# Patient Record
Sex: Female | Born: 1945 | Race: White | Hispanic: No | Marital: Married
Health system: Southern US, Community
[De-identification: ages and names within clinical notes are randomized; demographics above are authoritative.]

---

## 2000-01-24 ENCOUNTER — Encounter: Payer: Self-pay | Admitting: Obstetrics and Gynecology

## 2000-01-24 ENCOUNTER — Encounter: Admission: RE | Admit: 2000-01-24 | Discharge: 2000-01-24 | Payer: Self-pay | Admitting: Obstetrics and Gynecology

## 2000-02-28 ENCOUNTER — Other Ambulatory Visit: Admission: RE | Admit: 2000-02-28 | Discharge: 2000-02-28 | Payer: Self-pay | Admitting: Obstetrics and Gynecology

## 2001-01-29 ENCOUNTER — Encounter: Admission: RE | Admit: 2001-01-29 | Discharge: 2001-01-29 | Payer: Self-pay | Admitting: Obstetrics and Gynecology

## 2001-01-29 ENCOUNTER — Encounter: Payer: Self-pay | Admitting: Obstetrics and Gynecology

## 2001-01-29 ENCOUNTER — Other Ambulatory Visit: Admission: RE | Admit: 2001-01-29 | Discharge: 2001-01-29 | Payer: Self-pay | Admitting: Obstetrics and Gynecology

## 2001-02-12 ENCOUNTER — Encounter: Payer: Self-pay | Admitting: Obstetrics and Gynecology

## 2001-02-12 ENCOUNTER — Encounter: Admission: RE | Admit: 2001-02-12 | Discharge: 2001-02-12 | Payer: Self-pay | Admitting: Obstetrics and Gynecology

## 2002-03-25 ENCOUNTER — Other Ambulatory Visit: Admission: RE | Admit: 2002-03-25 | Discharge: 2002-03-25 | Payer: Self-pay | Admitting: Gynecology

## 2002-03-25 ENCOUNTER — Encounter: Payer: Self-pay | Admitting: Gynecology

## 2002-03-25 ENCOUNTER — Encounter: Admission: RE | Admit: 2002-03-25 | Discharge: 2002-03-25 | Payer: Self-pay | Admitting: Gynecology

## 2002-07-29 ENCOUNTER — Ambulatory Visit (HOSPITAL_COMMUNITY): Admission: RE | Admit: 2002-07-29 | Discharge: 2002-07-29 | Payer: Self-pay | Admitting: Gastroenterology

## 2002-07-29 ENCOUNTER — Encounter (INDEPENDENT_AMBULATORY_CARE_PROVIDER_SITE_OTHER): Payer: Self-pay | Admitting: *Deleted

## 2003-03-31 ENCOUNTER — Encounter: Admission: RE | Admit: 2003-03-31 | Discharge: 2003-03-31 | Payer: Self-pay | Admitting: Emergency Medicine

## 2003-03-31 ENCOUNTER — Encounter: Payer: Self-pay | Admitting: Emergency Medicine

## 2003-03-31 ENCOUNTER — Other Ambulatory Visit: Admission: RE | Admit: 2003-03-31 | Discharge: 2003-03-31 | Payer: Self-pay | Admitting: Gynecology

## 2003-12-10 ENCOUNTER — Emergency Department (HOSPITAL_COMMUNITY): Admission: EM | Admit: 2003-12-10 | Discharge: 2003-12-10 | Payer: Self-pay | Admitting: Emergency Medicine

## 2004-04-12 ENCOUNTER — Encounter: Admission: RE | Admit: 2004-04-12 | Discharge: 2004-04-12 | Payer: Self-pay | Admitting: Gynecology

## 2004-04-19 ENCOUNTER — Other Ambulatory Visit: Admission: RE | Admit: 2004-04-19 | Discharge: 2004-04-19 | Payer: Self-pay | Admitting: Gynecology

## 2005-05-23 ENCOUNTER — Other Ambulatory Visit: Admission: RE | Admit: 2005-05-23 | Discharge: 2005-05-23 | Payer: Self-pay | Admitting: Gynecology

## 2005-05-31 ENCOUNTER — Encounter: Admission: RE | Admit: 2005-05-31 | Discharge: 2005-05-31 | Payer: Self-pay | Admitting: Gynecology

## 2006-07-11 ENCOUNTER — Encounter: Admission: RE | Admit: 2006-07-11 | Discharge: 2006-07-11 | Payer: Self-pay | Admitting: Gynecology

## 2006-07-25 ENCOUNTER — Other Ambulatory Visit: Admission: RE | Admit: 2006-07-25 | Discharge: 2006-07-25 | Payer: Self-pay | Admitting: Gynecology

## 2007-07-17 ENCOUNTER — Encounter: Admission: RE | Admit: 2007-07-17 | Discharge: 2007-07-17 | Payer: Self-pay | Admitting: Gynecology

## 2007-08-14 ENCOUNTER — Other Ambulatory Visit: Admission: RE | Admit: 2007-08-14 | Discharge: 2007-08-14 | Payer: Self-pay | Admitting: Gynecology

## 2008-02-23 ENCOUNTER — Ambulatory Visit (HOSPITAL_COMMUNITY): Admission: RE | Admit: 2008-02-23 | Discharge: 2008-02-23 | Payer: Self-pay | Admitting: Ophthalmology

## 2008-07-29 ENCOUNTER — Encounter: Admission: RE | Admit: 2008-07-29 | Discharge: 2008-07-29 | Payer: Self-pay | Admitting: Gynecology

## 2009-08-23 ENCOUNTER — Encounter: Admission: RE | Admit: 2009-08-23 | Discharge: 2009-08-23 | Payer: Self-pay | Admitting: Gynecology

## 2010-09-28 ENCOUNTER — Encounter: Admission: RE | Admit: 2010-09-28 | Discharge: 2010-09-28 | Payer: Self-pay | Admitting: Gynecology

## 2011-05-15 NOTE — Op Note (Signed)
[name], Jordan [name]              ACCOUNT NO.:  [id number]   MEDICAL RECORD NO.:  [id number]          PATIENT TYPE:  AMB   LOCATION:  SDS                          FACILITY:  [facility]   PHYSICIAN:  Jillyn Hidden A. Rankin, M.D.   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   PROCEDURE:  Posterior vitrectomy with membrane peel - internal limiting  membrane peel - 25 gauge OD.   SURGEON:  Alford Highland. Rankin, M.D.   ANESTHESIA:  Local retrobulbar monitored anesthesia control.   INDICATIONS FOR PROCEDURE:  The patient is a 65 year old woman who has  profound vision loss on the basis of macular topographic distortion in  this right eye with significant vision loss.  The patient understands  this is an attempt to peel off the topographic distortion so as to allow  the underlying retina to improve its anatomic natural state as well as  hopefully improve visual acuity.  She understands the risks of  anesthesia including the rare occurrence of death, but also to the eye  including but not limited to hemorrhage, infection, scarring, need for  further surgery, no change in vision, loss of vision, and progression of  disease despite intervention.  After appropriate signed consent was  obtained, the patient was taken to the operating room.   DESCRIPTION OF PROCEDURE:  In the operating room, appropriate monitoring  was followed by mild sedation.  5 mL of 2% Xylocaine was injected  retrobulbar with an additional 5 mL laterally in fashion modified Darel Hong.  The right periocular region was sterilely prepped and draped in  the usual  ophthalmic fashion.  The lid speculum applied.  25 gauge  trocar placed in the inferotemporal quadrant.  Superior trocars applied.  A core vitrectomy was then begun.  25 gauge vitrectomy.  Vitreous skirt  trimmed 360 degrees.  Severe topographic distortion was identified.  There appeared to be a combination of epiretinal membrane and internal  limiting membrane present, but the internal limiting membrane was  grasped and this was removed as a continuous sheet overlying the macula  and fovea region and typical whitening of the surface of the retina was  noted.   At this time, peripheral retina was inspected and found to be free of  retinal holes or tears.  No complications occurred.  At this time the  superior trocars were removed.  The infusion was removed.  Subconjunctival Decadron applied.  Sterile patch and Fox shield applied.  The patient tolerated the procedure without complications.      Alford Highland Rankin, M.D.  Electronically Signed     GAR/MEDQ  D:  02/23/2008  T:  02/24/2008  Job:  [id number]   cc:   Richarda Overlie, M.D.

## 2011-05-18 NOTE — Op Note (Signed)
   NAMEELISAVET, BUEHRER                        ACCOUNT NO.:  [id number]   MEDICAL RECORD NO.:  [id number]                   PATIENT TYPE:  AMB   LOCATION:  ENDO                                 FACILITY:  [facility]   PHYSICIAN:  Vida Rigger, M.D.                    DATE OF BIRTH:  03-14-1946   DATE OF PROCEDURE:  07/29/2002  DATE OF DISCHARGE:  07/29/2002                                 OPERATIVE REPORT   PROCEDURE:  Colonoscopy.   SURGEON:  Petra Kuba, M.D.   INDICATIONS FOR PROCEDURE:  Screening.  Consent was signed after risks,  benefits, methods, and options were thoroughly discussed in the office.   MEDICINES:  Demerol 70 and Versed 10.   DESCRIPTION OF PROCEDURE:  Rectal inspection was pertinent for external  hemorrhoids.  Digital exam was negative.  The pediatric video adjustable  colonoscope was inserted and easily advanced around the colon to the cecum.  This did require rolling her on her back and some abdominal pressure.   Upon insertion, left-sided diverticula were seen, but no other  abnormalities.  The cecum was identified by the appendiceal orifice and the  ileocecal valve.  The scope was then slowly withdrawn.  The prep was  adequate.  There was some liquid stool that required washing and suctioning.  The cecum, ascending, and transverse were normal.  The scope was withdrawn  around the left side of the colon.  There was mild to moderate left-sided  diverticula.  In the mid to distal sigmoid, a small polyp was seen, snared,  and electrocautery applied, and the polyp was removed, suctioned through the  scope, and collected in the trap.   The scope was withdrawn back to the rectum and retroflexed and pertinent for  some small internal hemorrhoids.  The scope was straightened and readvanced  a short ways up to the left side of the colon.  Air was suctioned and the  scope removed.   The patient tolerated the procedure well.  There was no obvious or immediate  complication.   ENDOSCOPIC ASSESSMENT:  1. Internal and external hemorrhoids.  2. Left-sided diverticula.  3. Small sigmoid polyp, status post snare.  4. Otherwise, within normal limits to the cecum.    PLAN:  Await pathology to determine future colonic screening, yearly rectals  and guaiacs per Dr. Doristine Counter and continue workup with a one-time EGD.  Please  see that dictation for further workup and plans.                                               Vida Rigger, M.D.    MM/MEDQ  D:  07/29/2002  T:  08/03/2002  Job:  [id number]   cc:   Delorse Lek, M.D.

## 2011-05-18 NOTE — Op Note (Signed)
   Jordan [name], [name]                        ACCOUNT NO.:  [id number]   MEDICAL RECORD NO.:  [id number]                   PATIENT TYPE:  AMB   LOCATION:  ENDO                                 FACILITY:  [facility]   PHYSICIAN:  Petra Kuba, M.D.                 DATE OF BIRTH:  [birth year]   DATE OF PROCEDURE:  07/29/2002  DATE OF DISCHARGE:                                 OPERATIVE REPORT   PROCEDURE:  EGD with biopsy.   INDICATIONS FOR PROCEDURE:  Longstanding upper tract symptoms, want to rule  out Barrett's or other abnormalities.  Consent was signed after risks,  benefits, methods. and options were thoroughly discussed in the office and  before any pre medications were given.   MEDICATIONS FOR PROCEDURE:  Demerol 30 mg, Versed 3 mg.   DESCRIPTION OF PROCEDURE:  The videoendoscope was inserted by direct vision.  The esophagus was normal.  In the distal esophagus, was a small to medium  size hiatal hernia.  The scope was passed into the stomach and advanced  through a normal antrum, normal pylorus, into a normal duodenal bulb and  advanced into a normal descending duodenum.  The scope was withdrawn back to  the bulb and a good look there ruled out ulcers in that location.  The scope  was withdrawn back to the stomach and retroflexed.  High in the cardia the  hiatal hernia was confirmed.  The angularis was normal.  Along the lesser  and greater curve, multiple tiny and small gastric polyps were seen.   The scope was straightened and straight visualization of the stomach  confirmed the multiple polyps and also confirmed some mild gastritis.  No  significant polyp or worrisome abnormality was seen.  Multiple biopsies were  obtained but it was impossible to biopsy and sample all of the polyps.  Air  was suctioned, the scope slowly withdrawn.  Again, a good look at the  esophagus was normal.  The scope was removed.   The patient tolerated the procedure well.  There was no obvious  immediate  complication.   ENDOSCOPIC DIAGNOSES:  1. Small to medium sized hiatal hernia.  2. Multiple tiny and small gastric polyps without worrisome stigmata status     post biopsy of some.  3. Mild gastritis.  4. Otherwise, normal esophagogastroduodenoscopy.   PLAN:  1. Await pathology.  2. Continue pump inhibitors.  3. Follow up p.r.n. or in six months.                                              Petra Kuba, M.D.   MEM/MEDQ  D:  07/29/2002  T:  08/03/2002  Job:  [id number]   cc:   Jordan [name], M.D.

## 2011-08-27 ENCOUNTER — Other Ambulatory Visit: Payer: Self-pay | Admitting: Gynecology

## 2011-08-27 DIAGNOSIS — Z1231 Encounter for screening mammogram for malignant neoplasm of breast: Secondary | ICD-10-CM

## 2011-09-13 ENCOUNTER — Other Ambulatory Visit: Payer: Self-pay | Admitting: Gastroenterology

## 2011-09-18 ENCOUNTER — Ambulatory Visit
Admission: RE | Admit: 2011-09-18 | Discharge: 2011-09-18 | Disposition: A | Discharge status: Home / Self Care | Payer: BC Managed Care – PPO | Source: Ambulatory Visit | Attending: Gastroenterology | Admitting: Gastroenterology

## 2011-09-21 LAB — PROTIME-INR
INR: 0.9
Prothrombin Time: 12.2

## 2011-09-21 LAB — BASIC METABOLIC PANEL
BUN: 10
CO2: 27
Calcium: 9.7
Chloride: 105
Creatinine, Ser: 0.73
GFR calc Af Amer: >60
GFR calc non Af Amer: >60
Glucose, Bld: 91
Potassium: 4
Sodium: 139

## 2011-09-21 LAB — CBC
HCT: 38.8
Hemoglobin: 13
MCHC: 33.4
MCV: 90.1
Platelets: 289
RBC: 4.31
RDW: 14
WBC: 6.9

## 2011-09-21 LAB — APTT: aPTT: 29

## 2011-10-02 ENCOUNTER — Ambulatory Visit
Admission: RE | Admit: 2011-10-02 | Discharge: 2011-10-02 | Disposition: A | Discharge status: Home / Self Care | Payer: Medicare Other | Source: Ambulatory Visit | Attending: Gynecology | Admitting: Gynecology

## 2011-10-02 DIAGNOSIS — Z1231 Encounter for screening mammogram for malignant neoplasm of breast: Secondary | ICD-10-CM

## 2011-10-12 ENCOUNTER — Emergency Department (HOSPITAL_COMMUNITY): Payer: Medicare Other

## 2011-10-12 ENCOUNTER — Emergency Department (HOSPITAL_COMMUNITY)
Admission: EM | Admit: 2011-10-12 | Discharge: 2011-10-12 | Disposition: A | Discharge status: Home / Self Care | Payer: Medicare Other | Attending: Emergency Medicine | Admitting: Emergency Medicine

## 2011-10-12 DIAGNOSIS — R0602 Shortness of breath: Secondary | ICD-10-CM | POA: Insufficient documentation

## 2011-10-12 DIAGNOSIS — R110 Nausea: Secondary | ICD-10-CM | POA: Insufficient documentation

## 2011-10-12 DIAGNOSIS — E785 Hyperlipidemia, unspecified: Secondary | ICD-10-CM | POA: Insufficient documentation

## 2011-10-12 DIAGNOSIS — R0989 Other specified symptoms and signs involving the circulatory and respiratory systems: Secondary | ICD-10-CM | POA: Insufficient documentation

## 2011-10-12 DIAGNOSIS — R0609 Other forms of dyspnea: Secondary | ICD-10-CM | POA: Insufficient documentation

## 2011-10-12 LAB — COMPREHENSIVE METABOLIC PANEL
ALT: 31 U/L (ref 0–35)
AST: 23 U/L (ref 0–37)
Albumin: 3.4 g/dL — ABNORMAL LOW (ref 3.5–5.2)
Alkaline Phosphatase: 78 U/L (ref 39–117)
BUN: 12 mg/dL (ref 6–23)
CO2: 23 mEq/L (ref 19–32)
Calcium: 8.7 mg/dL (ref 8.4–10.5)
Chloride: 103 mEq/L (ref 96–112)
Creatinine, Ser: 0.6 mg/dL (ref 0.50–1.10)
GFR calc Af Amer: >90 mL/min (ref >90)
GFR calc non Af Amer: >90 mL/min (ref >90)
Glucose, Bld: 126 mg/dL — ABNORMAL HIGH (ref 70–99)
Potassium: 3.4 mEq/L — ABNORMAL LOW (ref 3.5–5.1)
Sodium: 137 mEq/L (ref 135–145)
Total Bilirubin: 0.4 mg/dL (ref 0.3–1.2)
Total Protein: 6.8 g/dL (ref 6.0–8.3)

## 2011-10-12 LAB — CBC
HCT: 32.5 % — ABNORMAL LOW (ref 36.0–46.0)
Hemoglobin: 10.5 g/dL — ABNORMAL LOW (ref 12.0–15.0)
MCH: 27.6 pg (ref 26.0–34.0)
MCHC: 32.3 g/dL (ref 30.0–36.0)
MCV: 85.3 fL (ref 78.0–100.0)
Platelets: 262 10*3/uL (ref 150–400)
RBC: 3.81 MIL/uL — ABNORMAL LOW (ref 3.87–5.11)
RDW: 14.2 % (ref 11.5–15.5)
WBC: 11.1 10*3/uL — ABNORMAL HIGH (ref 4.0–10.5)

## 2011-10-12 LAB — URINALYSIS, ROUTINE W REFLEX MICROSCOPIC
Bilirubin Urine: NEGATIVE
Glucose, UA: NEGATIVE mg/dL
Hgb urine dipstick: NEGATIVE
Ketones, ur: 15 mg/dL — AB
Leukocytes, UA: NEGATIVE
Nitrite: NEGATIVE
Protein, ur: NEGATIVE mg/dL
Specific Gravity, Urine: 1.024 (ref 1.005–1.030)
Urobilinogen, UA: 0.2 mg/dL (ref 0.0–1.0)
pH: 7 (ref 5.0–8.0)

## 2011-10-12 LAB — DIFFERENTIAL
Basophils Absolute: 0 10*3/uL (ref 0.0–0.1)
Basophils Relative: 0 % (ref 0–1)
Eosinophils Absolute: 0 10*3/uL (ref 0.0–0.7)
Eosinophils Relative: 0 % (ref 0–5)
Lymphocytes Relative: 9 % — ABNORMAL LOW (ref 12–46)
Lymphs Abs: 1 10*3/uL (ref 0.7–4.0)
Monocytes Absolute: 0.6 10*3/uL (ref 0.1–1.0)
Monocytes Relative: 6 % (ref 3–12)
Neutro Abs: 9.4 10*3/uL — ABNORMAL HIGH (ref 1.7–7.7)
Neutrophils Relative %: 85 % — ABNORMAL HIGH (ref 43–77)

## 2011-10-12 LAB — CK TOTAL AND CKMB (NOT AT ARMC)
CK, MB: 2.9 ng/mL (ref 0.3–4.0)
Relative Index: 1.8 (ref 0.0–2.5)
Total CK: 165 U/L (ref 7–177)

## 2011-10-12 LAB — POCT I-STAT TROPONIN I: Troponin i, poc: 0 ng/mL (ref 0.00–0.08)

## 2011-10-12 LAB — TROPONIN I: Troponin I: <0.3 ng/mL (ref <0.30)

## 2011-10-18 ENCOUNTER — Other Ambulatory Visit: Payer: Self-pay | Admitting: Gynecology

## 2012-09-19 ENCOUNTER — Other Ambulatory Visit: Payer: Self-pay | Admitting: Gynecology

## 2012-09-19 DIAGNOSIS — Z1231 Encounter for screening mammogram for malignant neoplasm of breast: Secondary | ICD-10-CM

## 2012-09-26 IMAGING — CR DG CHEST 2V
2 series · 2 of 2 positions shown · non-contrast
Comparison: 02/20/2008

CLINICAL DATA: Chest pain and weakness

CHEST - 2 VIEW

[w chest pa]
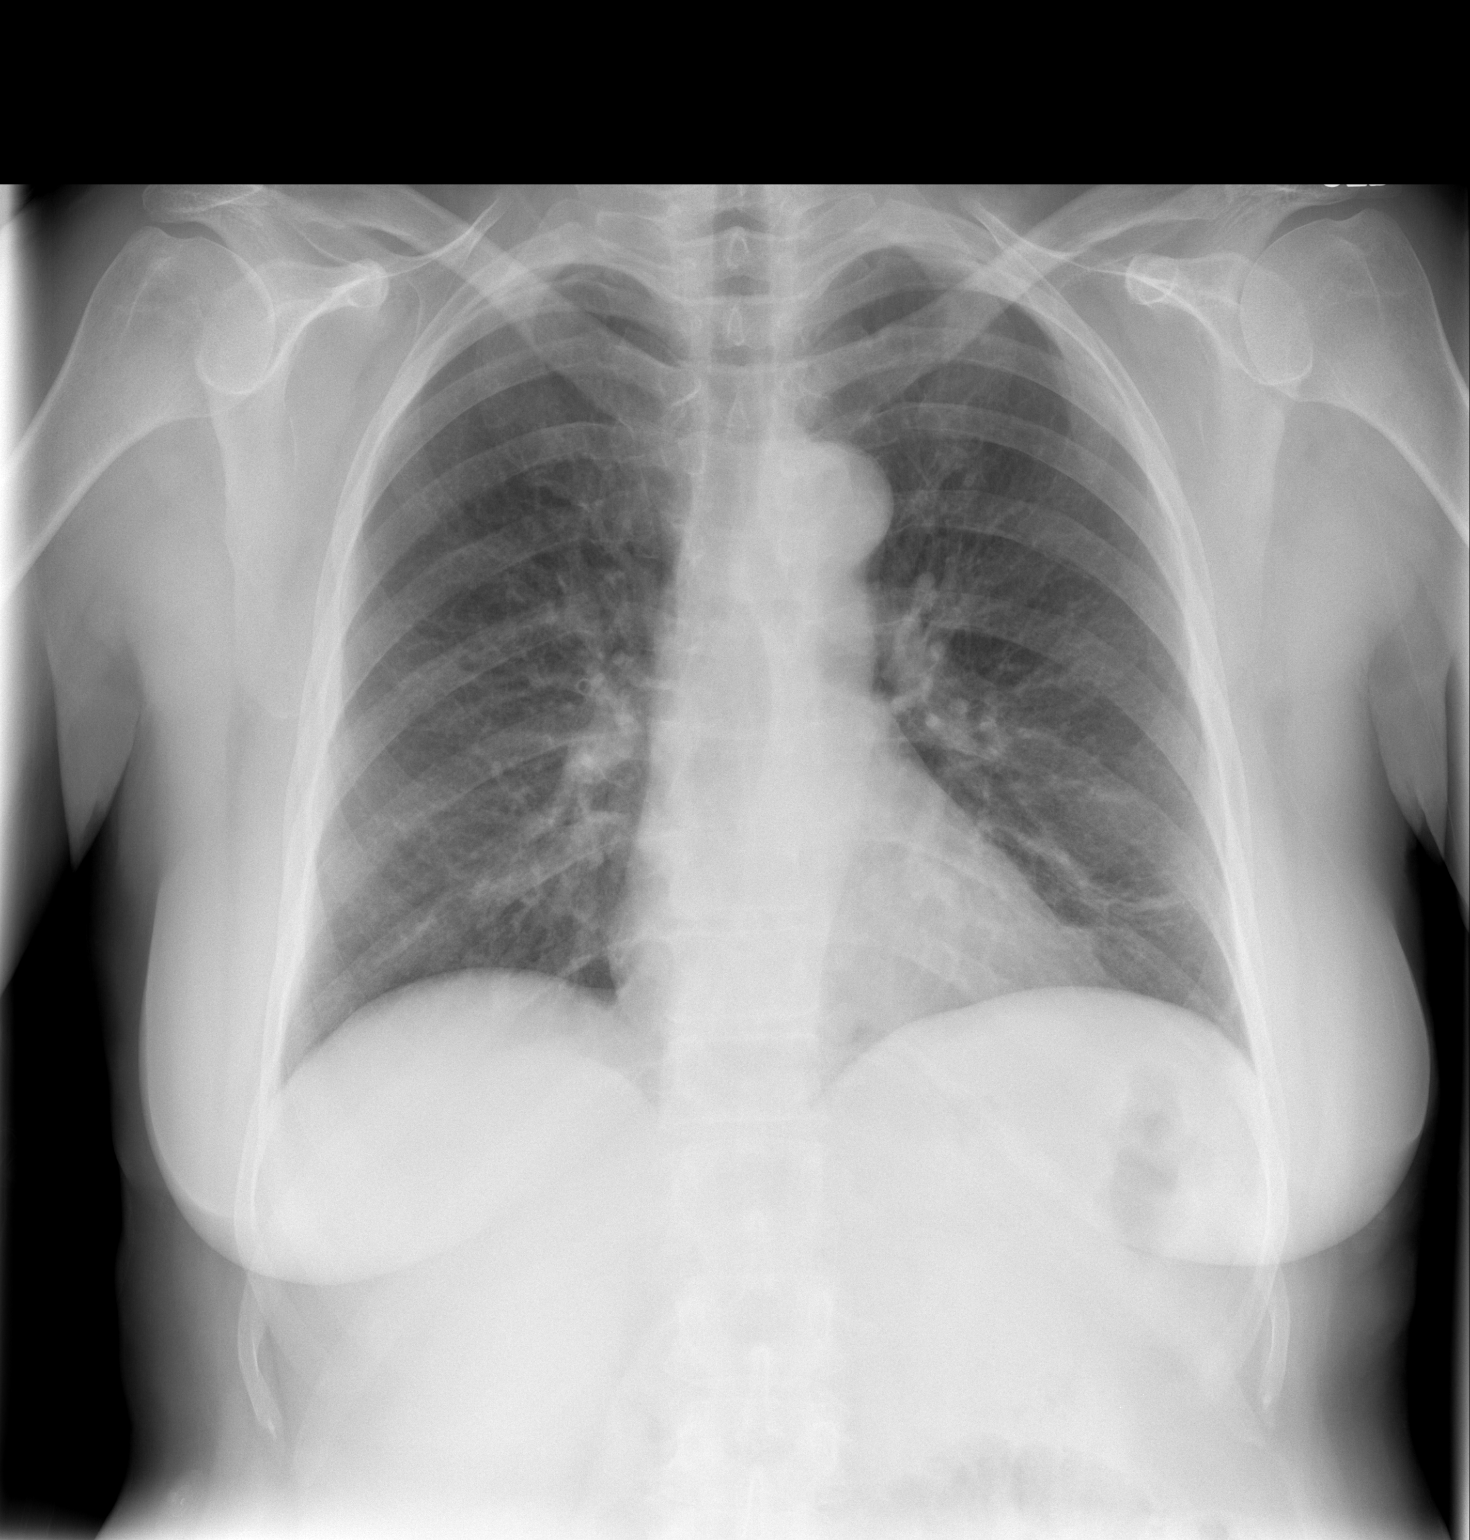

[w chest lat]
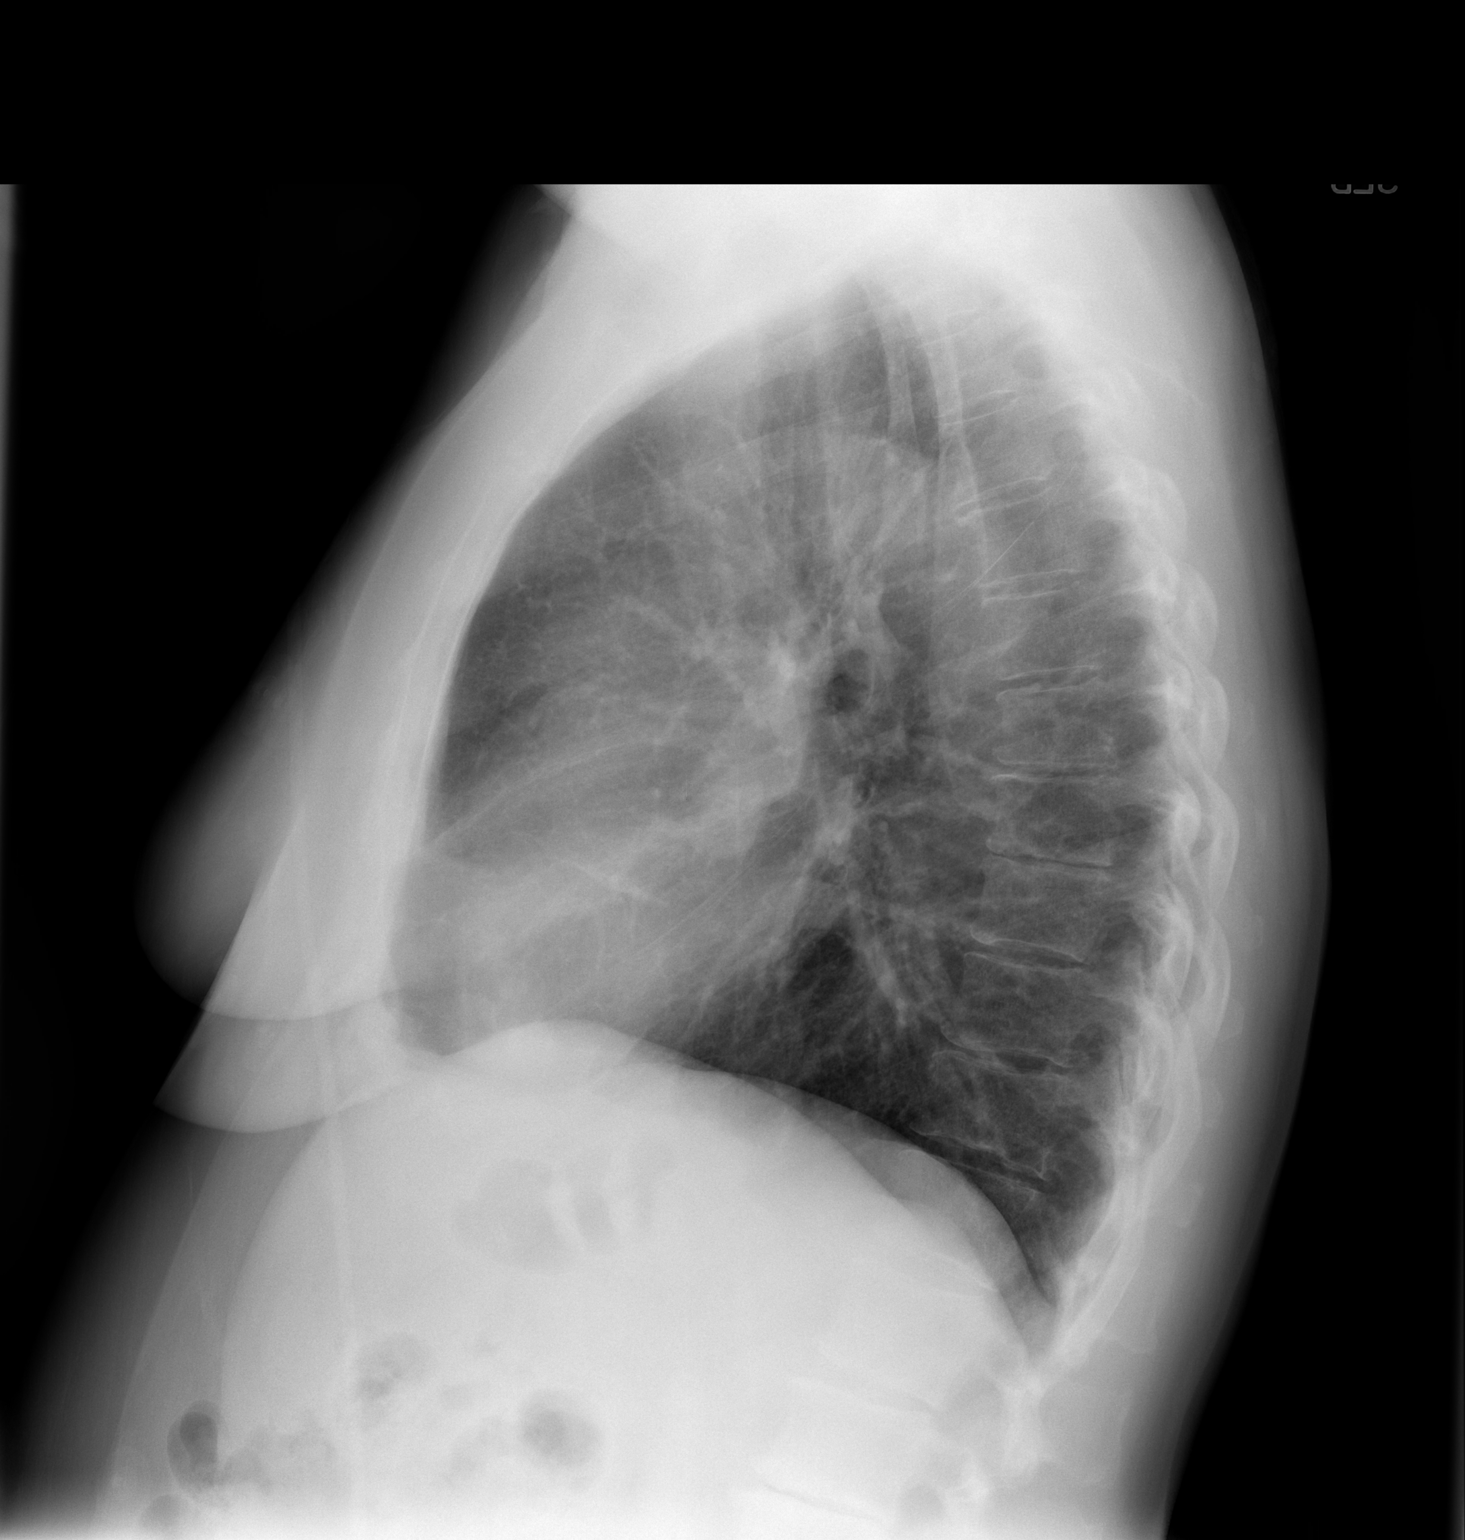

[2 of 2 positions shown; findings below may reference images not displayed]

FINDINGS: The heart and pulmonary vascularity are within normal
limits.  The lungs are well-aerated bilaterally.  Minimal scarring
remains in the left lung base stable from prior exam.  No other
focal abnormality is seen.
IMPRESSION: Stable scarring in the left lung base.  No acute abnormality is
noted.

## 2012-10-14 ENCOUNTER — Ambulatory Visit: Payer: Medicare Other

## 2012-10-16 ENCOUNTER — Ambulatory Visit
Admission: RE | Admit: 2012-10-16 | Discharge: 2012-10-16 | Disposition: A | Discharge status: Home / Self Care | Payer: Medicare Other | Source: Ambulatory Visit | Attending: Gynecology | Admitting: Gynecology

## 2012-10-16 DIAGNOSIS — Z1231 Encounter for screening mammogram for malignant neoplasm of breast: Secondary | ICD-10-CM

## 2013-09-17 ENCOUNTER — Other Ambulatory Visit: Payer: Self-pay

## 2013-09-17 DIAGNOSIS — Z1231 Encounter for screening mammogram for malignant neoplasm of breast: Secondary | ICD-10-CM

## 2013-10-20 ENCOUNTER — Ambulatory Visit
Admission: RE | Admit: 2013-10-20 | Discharge: 2013-10-20 | Disposition: A | Discharge status: Home / Self Care | Payer: Medicare Other | Source: Ambulatory Visit

## 2013-10-20 DIAGNOSIS — Z1231 Encounter for screening mammogram for malignant neoplasm of breast: Secondary | ICD-10-CM

## 2014-04-08 ENCOUNTER — Other Ambulatory Visit: Payer: Self-pay | Admitting: Internal Medicine

## 2014-04-08 DIAGNOSIS — E2839 Other primary ovarian failure: Secondary | ICD-10-CM

## 2014-04-08 DIAGNOSIS — Z780 Asymptomatic menopausal state: Secondary | ICD-10-CM

## 2014-04-14 ENCOUNTER — Other Ambulatory Visit: Payer: Medicare Other

## 2014-05-03 ENCOUNTER — Ambulatory Visit
Admission: RE | Admit: 2014-05-03 | Discharge: 2014-05-03 | Disposition: A | Discharge status: Home / Self Care | Payer: Medicare Other | Source: Ambulatory Visit | Attending: Internal Medicine | Admitting: Internal Medicine

## 2014-05-03 ENCOUNTER — Encounter (INDEPENDENT_AMBULATORY_CARE_PROVIDER_SITE_OTHER): Payer: Self-pay

## 2014-05-03 DIAGNOSIS — E2839 Other primary ovarian failure: Secondary | ICD-10-CM

## 2014-09-16 ENCOUNTER — Other Ambulatory Visit: Payer: Self-pay

## 2014-10-12 ENCOUNTER — Other Ambulatory Visit: Payer: Self-pay

## 2014-10-12 DIAGNOSIS — Z1231 Encounter for screening mammogram for malignant neoplasm of breast: Secondary | ICD-10-CM

## 2014-11-03 ENCOUNTER — Ambulatory Visit
Admission: RE | Admit: 2014-11-03 | Discharge: 2014-11-03 | Disposition: A | Discharge status: Home / Self Care | Payer: Medicare Other | Source: Ambulatory Visit

## 2014-11-03 DIAGNOSIS — Z1231 Encounter for screening mammogram for malignant neoplasm of breast: Secondary | ICD-10-CM

## 2015-11-16 ENCOUNTER — Other Ambulatory Visit: Payer: Self-pay

## 2015-11-16 DIAGNOSIS — Z1231 Encounter for screening mammogram for malignant neoplasm of breast: Secondary | ICD-10-CM

## 2015-12-21 ENCOUNTER — Ambulatory Visit: Payer: Medicare Other

## 2016-01-18 ENCOUNTER — Ambulatory Visit
Admission: RE | Admit: 2016-01-18 | Discharge: 2016-01-18 | Disposition: A | Discharge status: Home / Self Care | Payer: Medicare Other | Source: Ambulatory Visit

## 2016-01-18 DIAGNOSIS — Z1231 Encounter for screening mammogram for malignant neoplasm of breast: Secondary | ICD-10-CM

## 2016-06-05 DIAGNOSIS — E7800 Pure hypercholesterolemia, unspecified: Secondary | ICD-10-CM | POA: Insufficient documentation

## 2016-06-05 DIAGNOSIS — K219 Gastro-esophageal reflux disease without esophagitis: Secondary | ICD-10-CM | POA: Insufficient documentation

## 2016-06-05 DIAGNOSIS — G4700 Insomnia, unspecified: Secondary | ICD-10-CM | POA: Insufficient documentation

## 2017-01-30 ENCOUNTER — Other Ambulatory Visit: Payer: Self-pay | Admitting: Physician Assistant

## 2017-01-30 ENCOUNTER — Other Ambulatory Visit: Payer: Self-pay

## 2017-01-30 DIAGNOSIS — Z1231 Encounter for screening mammogram for malignant neoplasm of breast: Secondary | ICD-10-CM

## 2017-02-28 ENCOUNTER — Ambulatory Visit
Admission: RE | Admit: 2017-02-28 | Discharge: 2017-02-28 | Disposition: A | Discharge status: Home / Self Care | Payer: Medicare Other | Source: Ambulatory Visit

## 2017-02-28 DIAGNOSIS — Z1231 Encounter for screening mammogram for malignant neoplasm of breast: Secondary | ICD-10-CM

## 2017-12-20 ENCOUNTER — Other Ambulatory Visit: Payer: Self-pay | Admitting: Physician Assistant

## 2017-12-20 DIAGNOSIS — M8580 Other specified disorders of bone density and structure, unspecified site: Secondary | ICD-10-CM

## 2018-01-29 ENCOUNTER — Other Ambulatory Visit: Payer: Self-pay | Admitting: Physician Assistant

## 2018-01-29 DIAGNOSIS — Z1231 Encounter for screening mammogram for malignant neoplasm of breast: Secondary | ICD-10-CM

## 2018-03-03 ENCOUNTER — Ambulatory Visit
Admission: RE | Admit: 2018-03-03 | Discharge: 2018-03-03 | Disposition: A | Discharge status: Home / Self Care | Payer: BLUE CROSS/BLUE SHIELD | Source: Ambulatory Visit | Attending: Physician Assistant | Admitting: Physician Assistant

## 2018-03-03 ENCOUNTER — Ambulatory Visit
Admission: RE | Admit: 2018-03-03 | Discharge: 2018-03-03 | Disposition: A | Discharge status: Home / Self Care | Payer: Medicare Other | Source: Ambulatory Visit | Attending: Physician Assistant | Admitting: Physician Assistant

## 2018-03-03 DIAGNOSIS — M8580 Other specified disorders of bone density and structure, unspecified site: Secondary | ICD-10-CM

## 2018-03-03 DIAGNOSIS — Z1231 Encounter for screening mammogram for malignant neoplasm of breast: Secondary | ICD-10-CM

## 2019-01-12 ENCOUNTER — Other Ambulatory Visit: Payer: Self-pay | Admitting: Family Medicine

## 2019-01-12 ENCOUNTER — Other Ambulatory Visit (HOSPITAL_COMMUNITY): Payer: Self-pay | Admitting: Family Medicine

## 2019-01-12 DIAGNOSIS — R590 Localized enlarged lymph nodes: Secondary | ICD-10-CM

## 2019-01-15 ENCOUNTER — Ambulatory Visit (HOSPITAL_COMMUNITY)
Admission: RE | Admit: 2019-01-15 | Discharge: 2019-01-15 | Disposition: A | Discharge status: Home / Self Care | Payer: Medicare Other | Source: Ambulatory Visit | Attending: Family Medicine | Admitting: Family Medicine

## 2019-01-15 ENCOUNTER — Other Ambulatory Visit (HOSPITAL_COMMUNITY): Payer: Self-pay | Admitting: Family Medicine

## 2019-01-15 DIAGNOSIS — R590 Localized enlarged lymph nodes: Secondary | ICD-10-CM | POA: Insufficient documentation

## 2019-02-06 ENCOUNTER — Other Ambulatory Visit: Payer: Self-pay | Admitting: Family Medicine

## 2019-02-06 DIAGNOSIS — Z1231 Encounter for screening mammogram for malignant neoplasm of breast: Secondary | ICD-10-CM

## 2019-03-11 ENCOUNTER — Ambulatory Visit: Payer: Medicare Other

## 2019-04-09 ENCOUNTER — Ambulatory Visit: Payer: Medicare Other

## 2019-05-28 ENCOUNTER — Ambulatory Visit
Admission: RE | Admit: 2019-05-28 | Discharge: 2019-05-28 | Disposition: A | Discharge status: Home / Self Care | Payer: Medicare Other | Source: Ambulatory Visit | Attending: Family Medicine | Admitting: Family Medicine

## 2019-05-28 ENCOUNTER — Other Ambulatory Visit: Payer: Self-pay

## 2019-05-28 DIAGNOSIS — Z1231 Encounter for screening mammogram for malignant neoplasm of breast: Secondary | ICD-10-CM

## 2020-03-03 ENCOUNTER — Other Ambulatory Visit: Payer: Self-pay | Admitting: Family Medicine

## 2020-03-03 DIAGNOSIS — E2839 Other primary ovarian failure: Secondary | ICD-10-CM

## 2020-03-03 DIAGNOSIS — Z1231 Encounter for screening mammogram for malignant neoplasm of breast: Secondary | ICD-10-CM

## 2020-03-03 DIAGNOSIS — M8580 Other specified disorders of bone density and structure, unspecified site: Secondary | ICD-10-CM

## 2020-05-31 ENCOUNTER — Other Ambulatory Visit: Payer: Self-pay

## 2020-05-31 ENCOUNTER — Ambulatory Visit
Admission: RE | Admit: 2020-05-31 | Discharge: 2020-05-31 | Disposition: A | Discharge status: Home / Self Care | Payer: Medicare Other | Source: Ambulatory Visit | Attending: Family Medicine | Admitting: Family Medicine

## 2020-05-31 DIAGNOSIS — E2839 Other primary ovarian failure: Secondary | ICD-10-CM

## 2020-05-31 DIAGNOSIS — Z1231 Encounter for screening mammogram for malignant neoplasm of breast: Secondary | ICD-10-CM

## 2020-05-31 DIAGNOSIS — M8580 Other specified disorders of bone density and structure, unspecified site: Secondary | ICD-10-CM

## 2020-06-01 ENCOUNTER — Other Ambulatory Visit: Payer: Self-pay | Admitting: Family Medicine

## 2020-06-01 DIAGNOSIS — R928 Other abnormal and inconclusive findings on diagnostic imaging of breast: Secondary | ICD-10-CM

## 2020-06-09 ENCOUNTER — Other Ambulatory Visit: Payer: Self-pay

## 2020-06-09 ENCOUNTER — Ambulatory Visit
Admission: RE | Admit: 2020-06-09 | Discharge: 2020-06-09 | Disposition: A | Discharge status: Home / Self Care | Payer: Medicare Other | Source: Ambulatory Visit | Attending: Family Medicine | Admitting: Family Medicine

## 2020-06-09 ENCOUNTER — Other Ambulatory Visit: Payer: Self-pay | Admitting: Family Medicine

## 2020-06-09 DIAGNOSIS — R928 Other abnormal and inconclusive findings on diagnostic imaging of breast: Secondary | ICD-10-CM

## 2020-06-09 DIAGNOSIS — N6320 Unspecified lump in the left breast, unspecified quadrant: Secondary | ICD-10-CM

## 2020-06-14 ENCOUNTER — Other Ambulatory Visit: Payer: Self-pay | Admitting: Family Medicine

## 2020-06-14 ENCOUNTER — Ambulatory Visit
Admission: RE | Admit: 2020-06-14 | Discharge: 2020-06-14 | Disposition: A | Discharge status: Home / Self Care | Payer: Medicare Other | Source: Ambulatory Visit | Attending: Family Medicine | Admitting: Family Medicine

## 2020-06-14 ENCOUNTER — Other Ambulatory Visit: Payer: Self-pay

## 2020-06-14 DIAGNOSIS — N6320 Unspecified lump in the left breast, unspecified quadrant: Secondary | ICD-10-CM

## 2020-06-14 DIAGNOSIS — R928 Other abnormal and inconclusive findings on diagnostic imaging of breast: Secondary | ICD-10-CM

## 2020-06-14 HISTORY — PX: BREAST BIOPSY: SHX20

## 2020-09-16 DIAGNOSIS — R2232 Localized swelling, mass and lump, left upper limb: Secondary | ICD-10-CM | POA: Insufficient documentation

## 2020-09-21 ENCOUNTER — Other Ambulatory Visit: Payer: Self-pay | Admitting: Orthopedic Surgery

## 2020-09-21 DIAGNOSIS — M67432 Ganglion, left wrist: Secondary | ICD-10-CM

## 2020-09-28 ENCOUNTER — Other Ambulatory Visit: Payer: Self-pay | Admitting: Family Medicine

## 2020-09-28 DIAGNOSIS — R1013 Epigastric pain: Secondary | ICD-10-CM

## 2020-10-05 ENCOUNTER — Other Ambulatory Visit: Payer: Self-pay | Admitting: Family Medicine

## 2020-10-05 DIAGNOSIS — R1013 Epigastric pain: Secondary | ICD-10-CM

## 2020-10-06 ENCOUNTER — Ambulatory Visit
Admission: RE | Admit: 2020-10-06 | Discharge: 2020-10-06 | Disposition: A | Discharge status: Home / Self Care | Payer: Medicare Other | Source: Ambulatory Visit | Attending: Orthopedic Surgery | Admitting: Orthopedic Surgery

## 2020-10-06 ENCOUNTER — Ambulatory Visit
Admission: RE | Admit: 2020-10-06 | Discharge: 2020-10-06 | Disposition: A | Discharge status: Home / Self Care | Payer: Medicare Other | Source: Ambulatory Visit | Attending: Family Medicine | Admitting: Family Medicine

## 2020-10-06 DIAGNOSIS — R1013 Epigastric pain: Secondary | ICD-10-CM

## 2020-10-06 DIAGNOSIS — M67432 Ganglion, left wrist: Secondary | ICD-10-CM

## 2020-11-17 ENCOUNTER — Other Ambulatory Visit: Payer: Self-pay | Admitting: General Surgery

## 2020-11-17 DIAGNOSIS — D242 Benign neoplasm of left breast: Secondary | ICD-10-CM

## 2020-12-09 ENCOUNTER — Other Ambulatory Visit: Payer: Self-pay | Admitting: General Surgery

## 2020-12-09 DIAGNOSIS — D242 Benign neoplasm of left breast: Secondary | ICD-10-CM

## 2021-01-11 DIAGNOSIS — G5602 Carpal tunnel syndrome, left upper limb: Secondary | ICD-10-CM | POA: Insufficient documentation

## 2021-01-13 ENCOUNTER — Ambulatory Visit
Admission: RE | Admit: 2021-01-13 | Discharge: 2021-01-13 | Disposition: A | Discharge status: Home / Self Care | Payer: Medicare Other | Source: Ambulatory Visit | Attending: General Surgery | Admitting: General Surgery

## 2021-01-13 ENCOUNTER — Ambulatory Visit: Admission: RE | Admit: 2021-01-13 | Payer: Medicare Other | Source: Ambulatory Visit

## 2021-01-13 ENCOUNTER — Other Ambulatory Visit: Payer: Self-pay | Admitting: Family Medicine

## 2021-01-13 ENCOUNTER — Other Ambulatory Visit: Payer: Self-pay

## 2021-01-13 DIAGNOSIS — D242 Benign neoplasm of left breast: Secondary | ICD-10-CM

## 2021-01-13 DIAGNOSIS — Z1231 Encounter for screening mammogram for malignant neoplasm of breast: Secondary | ICD-10-CM

## 2021-05-16 IMAGING — MG DIGITAL SCREENING BILAT W/ TOMO W/ CAD
8 series · 9 of 24 positions shown · non-contrast
Comparison: Previous exam(s).

CLINICAL DATA: Screening.

EXAM:
DIGITAL SCREENING BILATERAL MAMMOGRAM WITH TOMO AND CAD

[R MLO synth-2D]
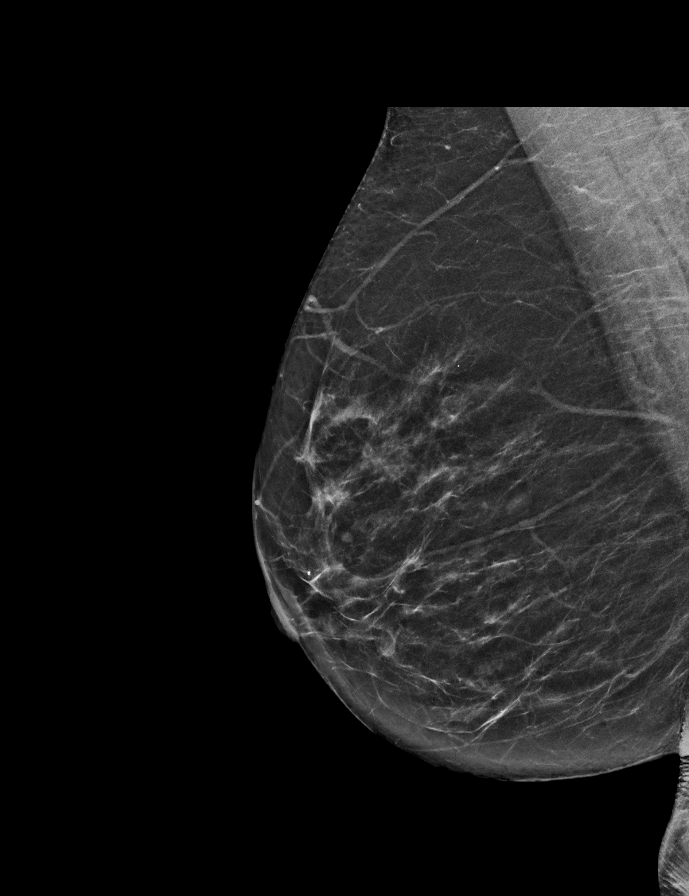

[R CC synth-2D]
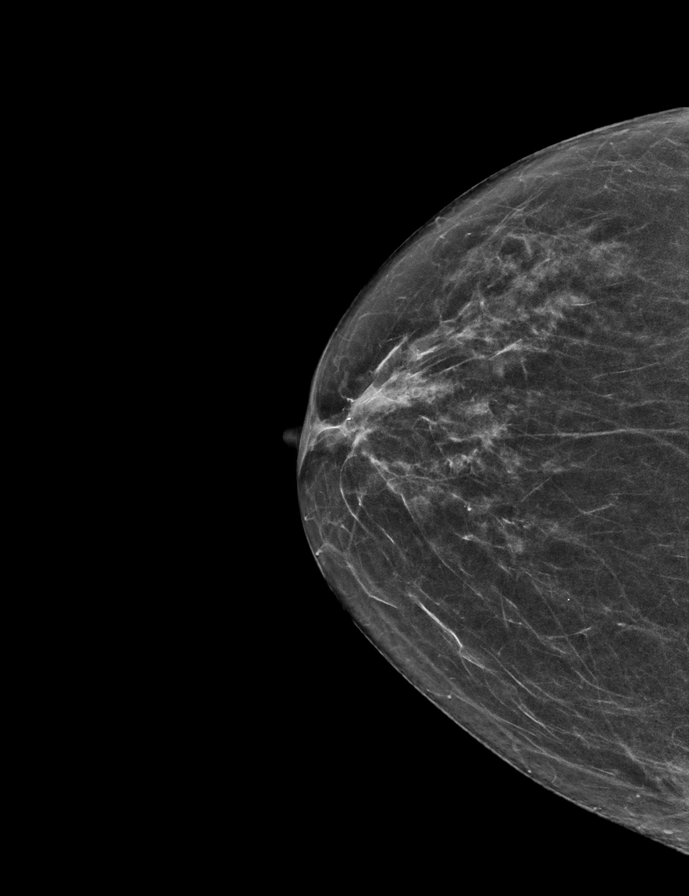

[L CC synth-2D]
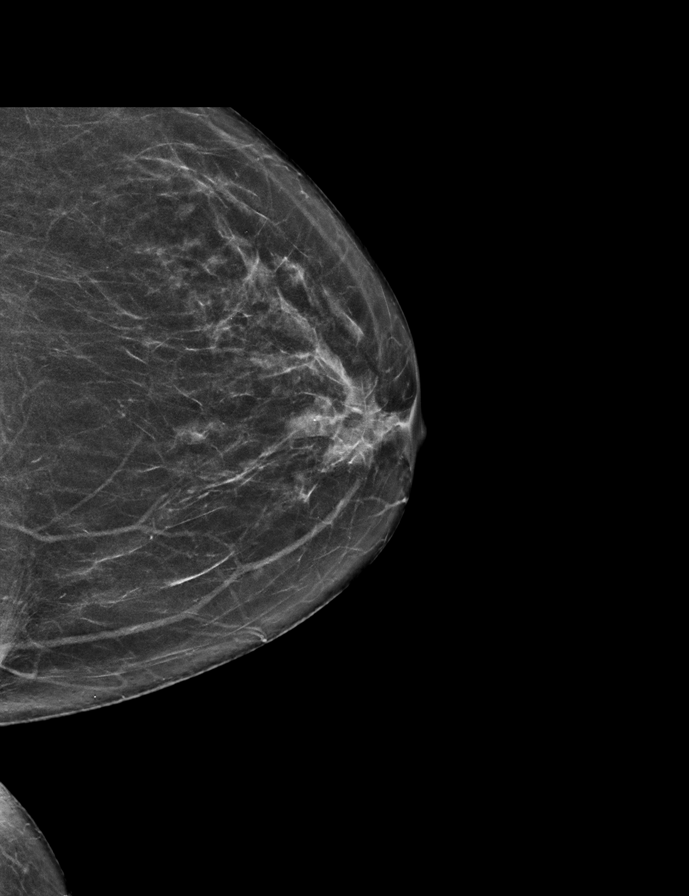

[L MLO synth-2D]
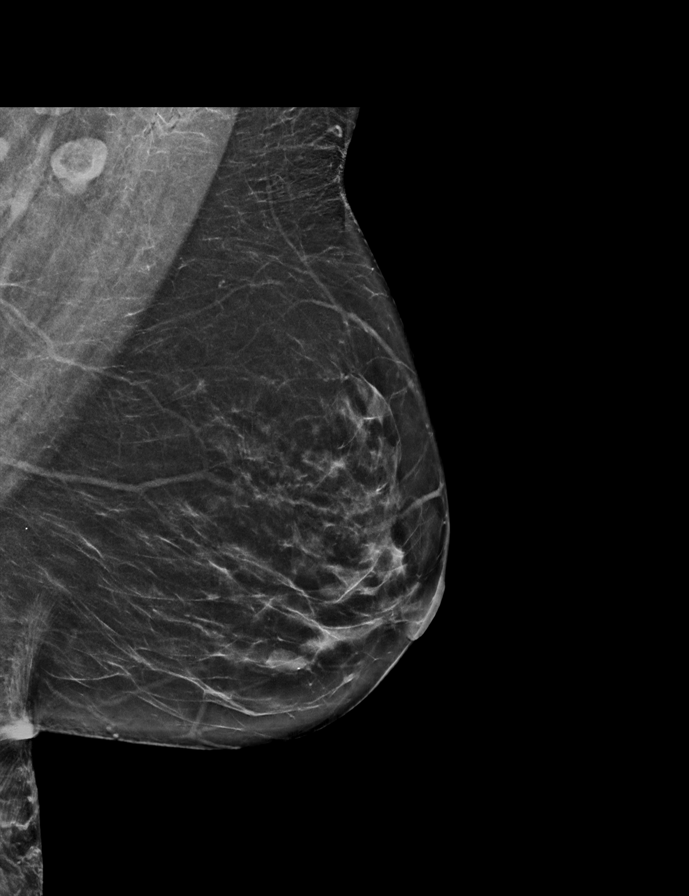

[L MLO tomo · 2 of 65 frames shown]
[frame 21/65]
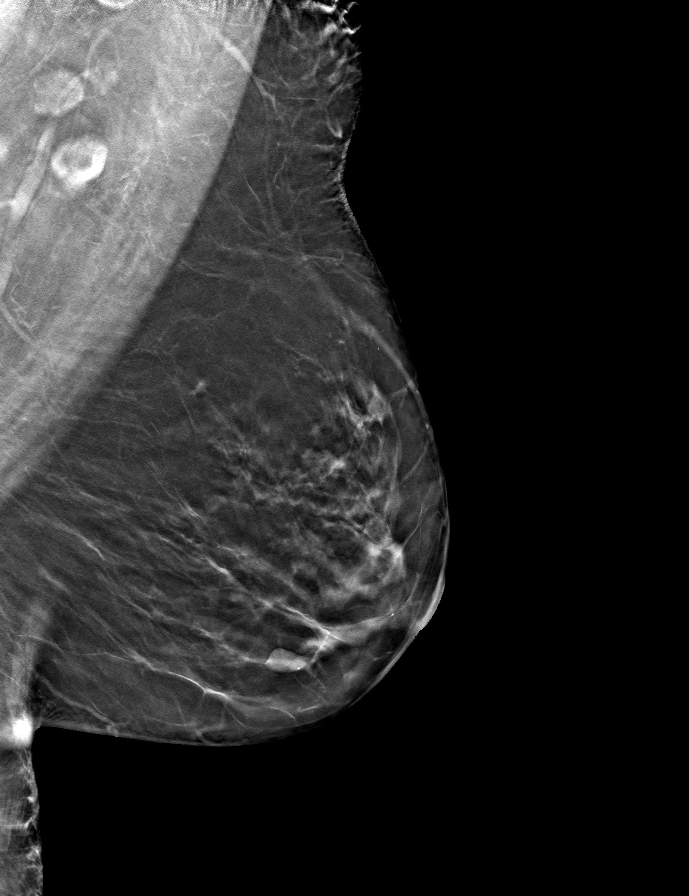
[frame 33/65]
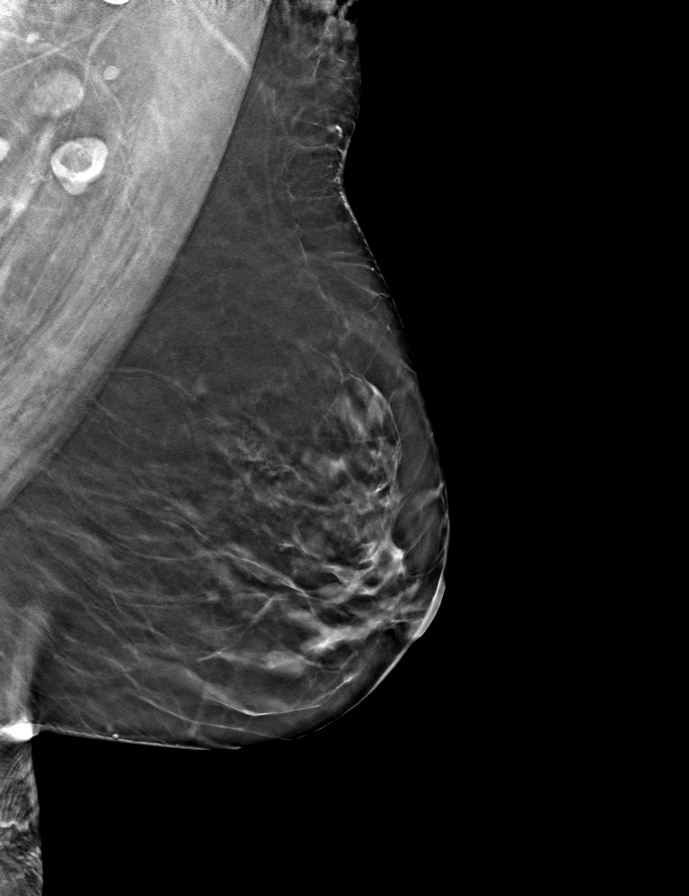

[R MLO tomo · tomo slice 29/56.0]
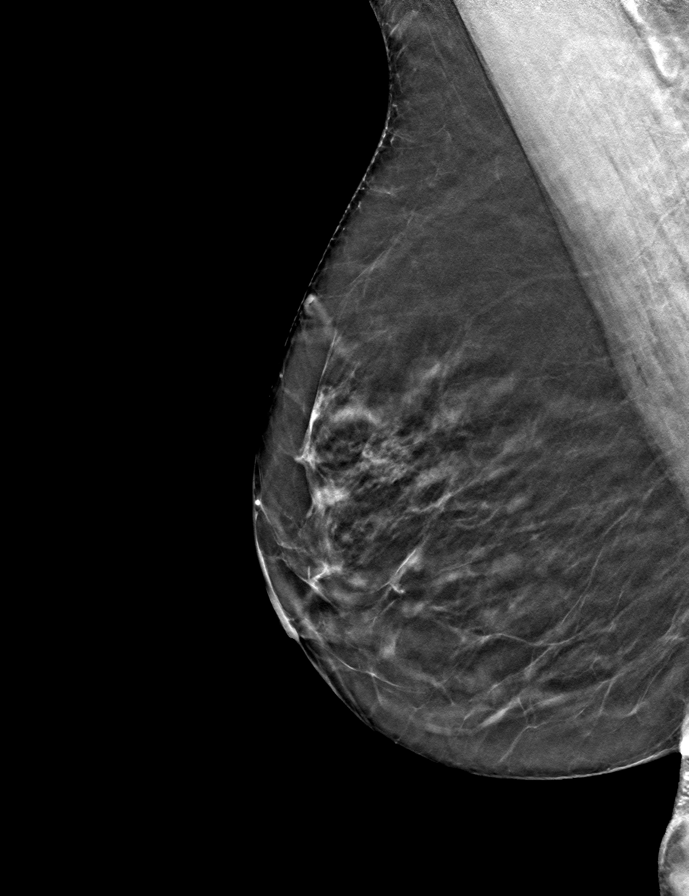

[R CC tomo · tomo slice 27/54.0]
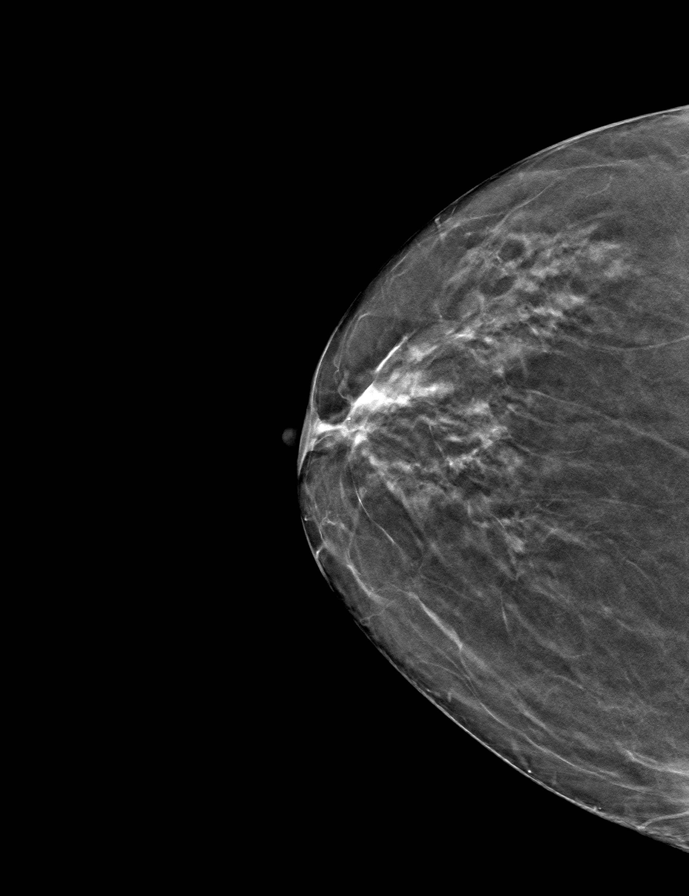

[L CC tomo · tomo slice 29/56.0]
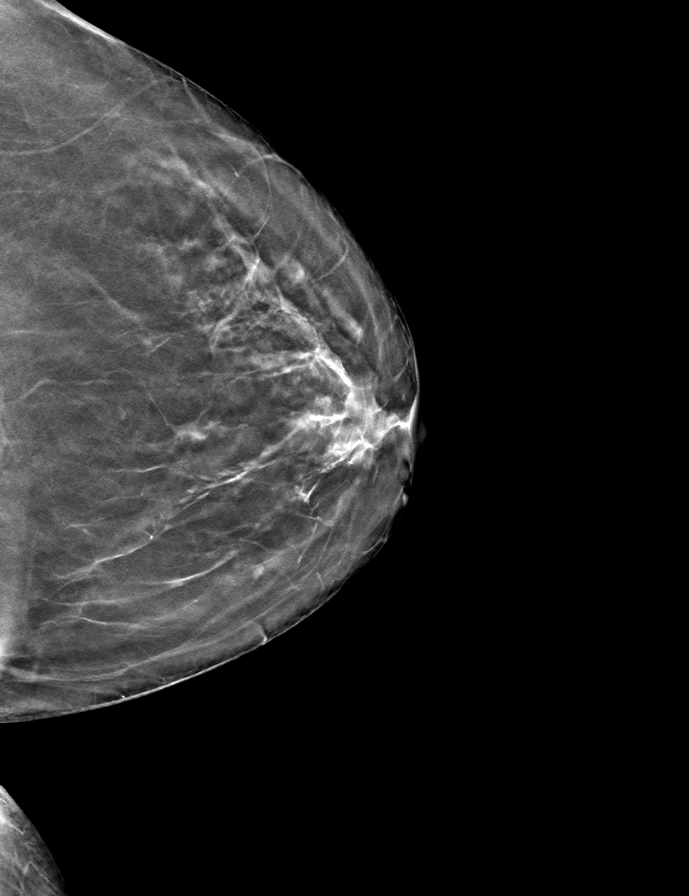

[9 of 24 positions shown; findings below may reference images not displayed]

ACR Breast Density Category b: There are scattered areas of
fibroglandular density.
FINDINGS: In the left breast, a possible mass warrants further evaluation. In
the right breast, no findings suspicious for malignancy.

Images were processed with CAD.
IMPRESSION: Further evaluation is suggested for possible mass in the left
breast.

RECOMMENDATION:
Diagnostic mammogram and possibly ultrasound of the left breast.
(Code:JC-2-SSL)

The patient will be contacted regarding the findings, and additional
imaging will be scheduled.

BI-RADS CATEGORY  0: Incomplete. Need additional imaging evaluation
and/or prior mammograms for comparison.

## 2021-05-25 IMAGING — US US BREAST*L* LIMITED INC AXILLA
1 series · 10 of 10 positions shown · non-contrast
Comparison: Previous exam(s).

CLINICAL DATA: Patient recalled from screening for left breast
mass.

EXAM:
DIGITAL DIAGNOSTIC LEFT MAMMOGRAM WITH CAD AND TOMO
ULTRASOUND LEFT BREAST

[Series 1: us breast*left* limited inc axilla · 0.06mm/px · 10 of 10 slices shown]
[im 1/10]
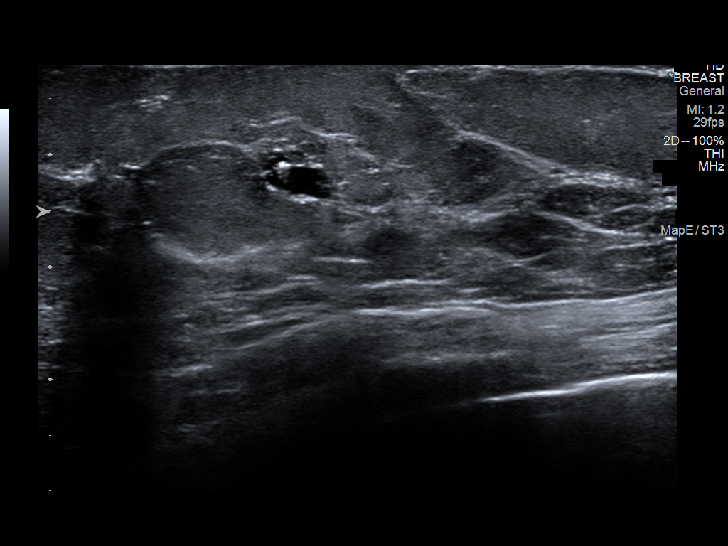
[im 2/10]
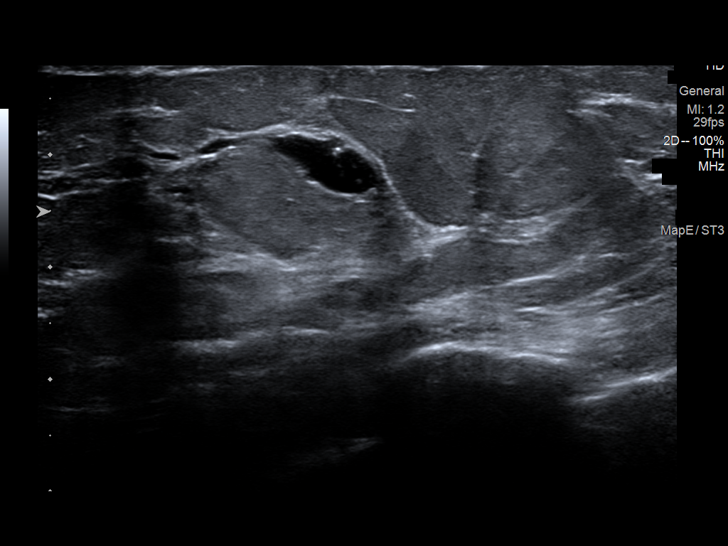
[im 3/10]
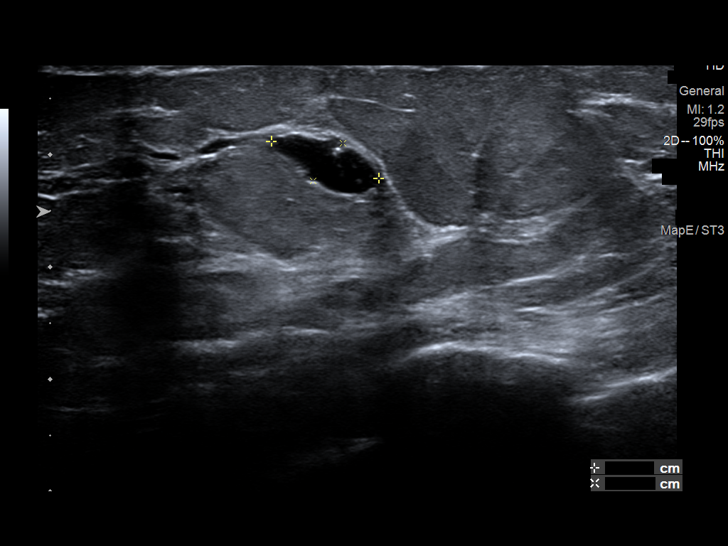
[im 4/10]
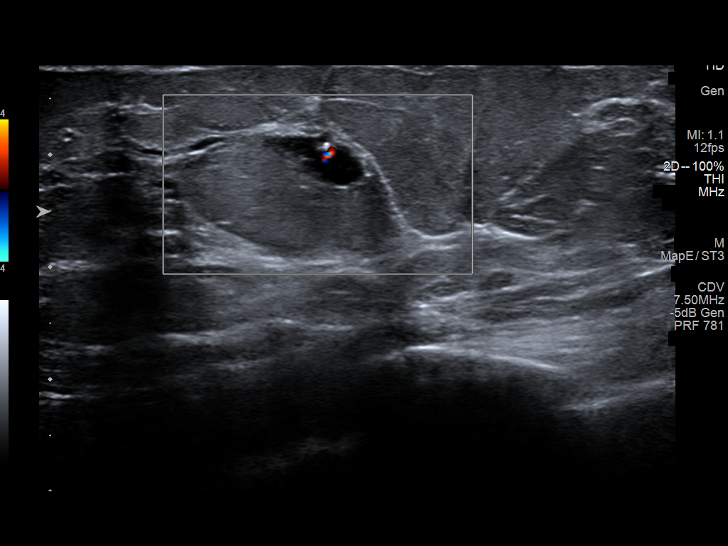
[im 5/10]
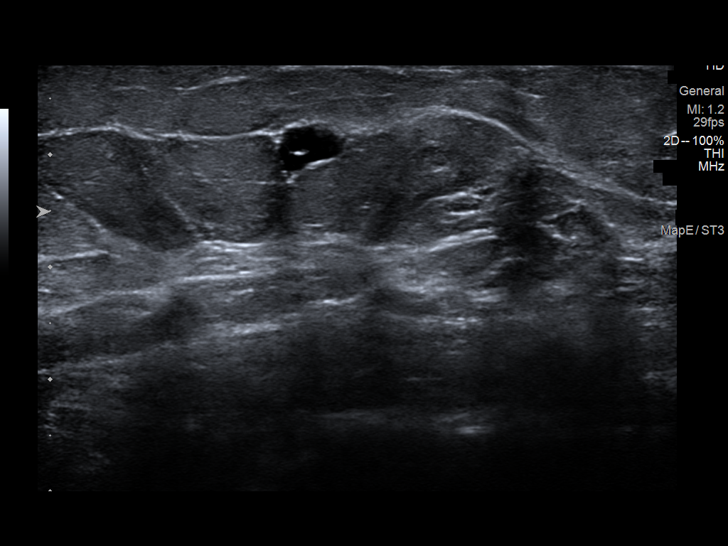
[im 6/10]
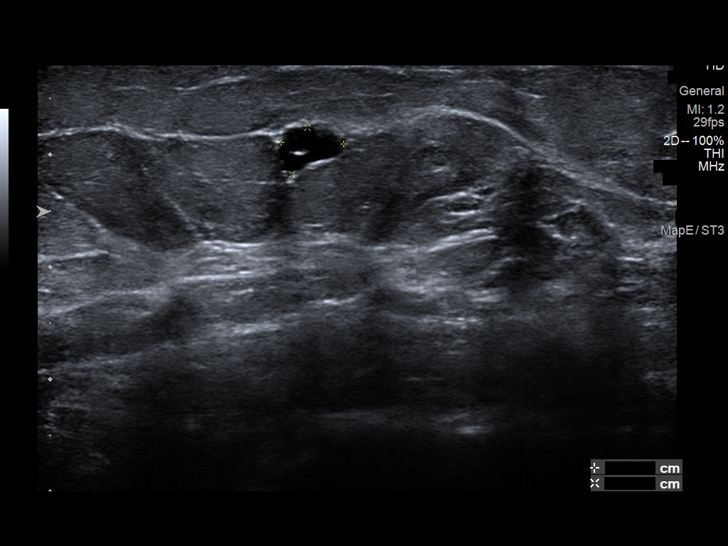
[im 7/10]
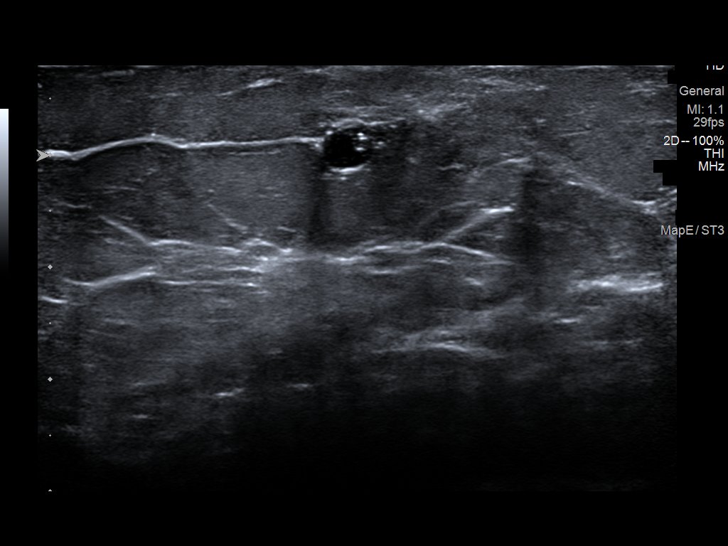
[im 8/10]
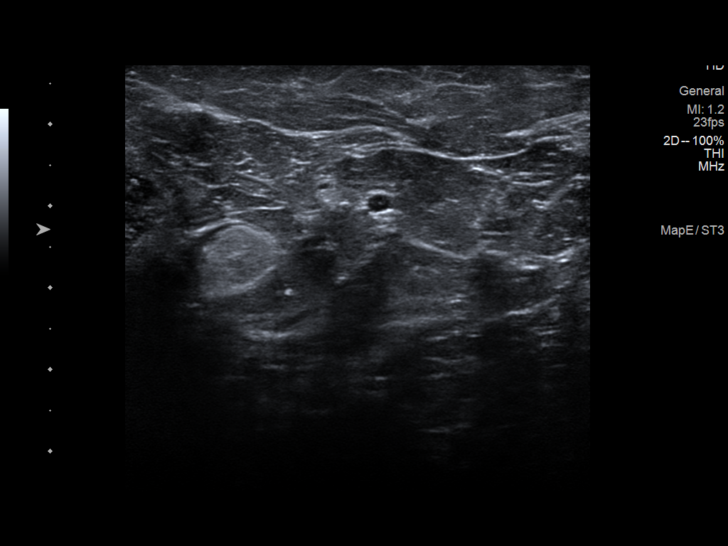
[im 9/10]
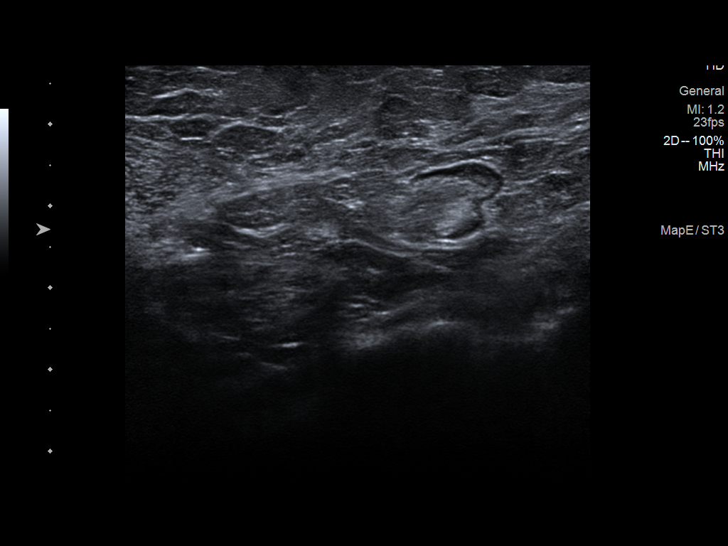
[im 10/10]
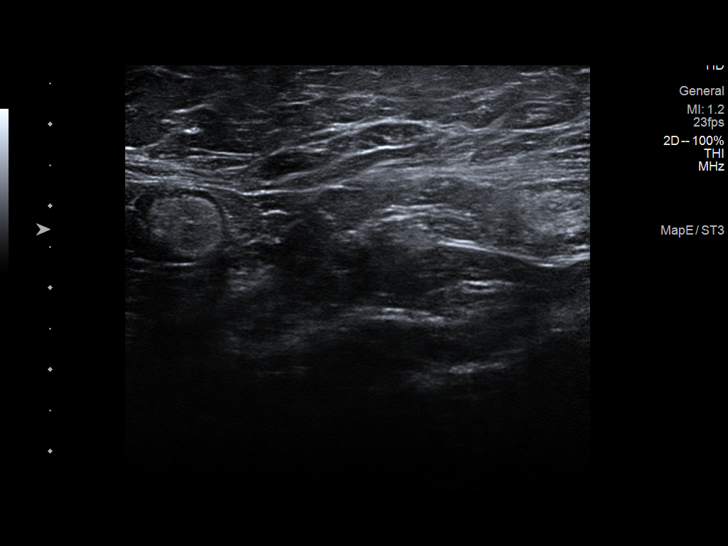

[10 of 10 positions shown; findings below may reference images not displayed]

ACR Breast Density Category b: There are scattered areas of
fibroglandular density.
FINDINGS: Within the 6 o'clock position left breast anterior depth there is a
persistent oval circumscribed mass, further evaluated with spot
compression views.

Mammographic images were processed with CAD.

On physical exam, no discrete mass is palpated within the inferior
left breast.

Targeted ultrasound is performed, showing a 10 x 4 x 5 mm
predominately cystic mass with peripheral nodularity. There is a
thick internal septation. Mass is located left breast 5 o'clock
position 2 cm from nipple.

No left axillary adenopathy.
IMPRESSION: Indeterminate predominately cystic mass with internal nodularity
left breast 5 o'clock position.

RECOMMENDATION:
Ultrasound-guided core needle biopsy left breast mass 5 o'clock
position. Recommend attention to the thick internal septation and
internal nodularity during the biopsy.

I have discussed the findings and recommendations with the patient.
If applicable, a reminder letter will be sent to the patient
regarding the next appointment.

BI-RADS CATEGORY  4: Suspicious.

## 2021-07-10 ENCOUNTER — Other Ambulatory Visit: Payer: Self-pay | Admitting: Orthopedic Surgery

## 2021-07-10 DIAGNOSIS — R2232 Localized swelling, mass and lump, left upper limb: Secondary | ICD-10-CM

## 2021-07-17 ENCOUNTER — Ambulatory Visit
Admission: RE | Admit: 2021-07-17 | Discharge: 2021-07-17 | Disposition: A | Discharge status: Home / Self Care | Payer: Medicare Other | Source: Ambulatory Visit | Attending: Family Medicine | Admitting: Family Medicine

## 2021-07-17 ENCOUNTER — Other Ambulatory Visit: Payer: Self-pay

## 2021-07-17 DIAGNOSIS — Z1231 Encounter for screening mammogram for malignant neoplasm of breast: Secondary | ICD-10-CM

## 2021-07-31 ENCOUNTER — Ambulatory Visit
Admission: RE | Admit: 2021-07-31 | Discharge: 2021-07-31 | Disposition: A | Discharge status: Home / Self Care | Payer: Medicare Other | Source: Ambulatory Visit | Attending: Orthopedic Surgery | Admitting: Orthopedic Surgery

## 2021-07-31 DIAGNOSIS — R2232 Localized swelling, mass and lump, left upper limb: Secondary | ICD-10-CM

## 2022-03-13 ENCOUNTER — Other Ambulatory Visit: Payer: Self-pay | Admitting: Family Medicine

## 2022-03-13 DIAGNOSIS — Z1231 Encounter for screening mammogram for malignant neoplasm of breast: Secondary | ICD-10-CM

## 2022-03-13 DIAGNOSIS — E2839 Other primary ovarian failure: Secondary | ICD-10-CM

## 2022-07-05 ENCOUNTER — Other Ambulatory Visit: Payer: Self-pay | Admitting: Family Medicine

## 2022-07-05 DIAGNOSIS — R1013 Epigastric pain: Secondary | ICD-10-CM

## 2022-07-05 DIAGNOSIS — R109 Unspecified abdominal pain: Secondary | ICD-10-CM

## 2022-08-02 ENCOUNTER — Other Ambulatory Visit: Payer: Medicare Other

## 2022-08-24 ENCOUNTER — Ambulatory Visit
Admission: RE | Admit: 2022-08-24 | Discharge: 2022-08-24 | Disposition: A | Discharge status: Home / Self Care | Payer: Medicare Other | Source: Ambulatory Visit | Attending: Family Medicine | Admitting: Family Medicine

## 2022-08-24 DIAGNOSIS — E2839 Other primary ovarian failure: Secondary | ICD-10-CM

## 2022-08-24 DIAGNOSIS — Z1231 Encounter for screening mammogram for malignant neoplasm of breast: Secondary | ICD-10-CM

## 2022-08-28 ENCOUNTER — Other Ambulatory Visit: Payer: Self-pay | Admitting: Family Medicine

## 2022-08-28 DIAGNOSIS — R928 Other abnormal and inconclusive findings on diagnostic imaging of breast: Secondary | ICD-10-CM

## 2022-09-06 ENCOUNTER — Ambulatory Visit
Admission: RE | Admit: 2022-09-06 | Discharge: 2022-09-06 | Disposition: A | Discharge status: Home / Self Care | Payer: Medicare Other | Source: Ambulatory Visit | Attending: Family Medicine | Admitting: Family Medicine

## 2022-09-06 ENCOUNTER — Other Ambulatory Visit: Payer: Self-pay | Admitting: Family Medicine

## 2022-09-06 DIAGNOSIS — R921 Mammographic calcification found on diagnostic imaging of breast: Secondary | ICD-10-CM

## 2022-09-06 DIAGNOSIS — R928 Other abnormal and inconclusive findings on diagnostic imaging of breast: Secondary | ICD-10-CM

## 2023-03-08 ENCOUNTER — Ambulatory Visit
Admission: RE | Admit: 2023-03-08 | Discharge: 2023-03-08 | Disposition: A | Discharge status: Home / Self Care | Payer: Medicare Other | Source: Ambulatory Visit | Attending: Family Medicine | Admitting: Family Medicine

## 2023-03-08 DIAGNOSIS — R921 Mammographic calcification found on diagnostic imaging of breast: Secondary | ICD-10-CM

## 2023-03-12 ENCOUNTER — Other Ambulatory Visit: Payer: Self-pay | Admitting: Family Medicine

## 2023-03-12 DIAGNOSIS — R921 Mammographic calcification found on diagnostic imaging of breast: Secondary | ICD-10-CM

## 2023-03-24 DIAGNOSIS — D500 Iron deficiency anemia secondary to blood loss (chronic): Secondary | ICD-10-CM | POA: Insufficient documentation

## 2023-03-24 DIAGNOSIS — K449 Diaphragmatic hernia without obstruction or gangrene: Secondary | ICD-10-CM | POA: Insufficient documentation

## 2023-09-09 ENCOUNTER — Ambulatory Visit
Admission: RE | Admit: 2023-09-09 | Discharge: 2023-09-09 | Disposition: A | Discharge status: Home / Self Care | Payer: Medicare Other | Source: Ambulatory Visit | Attending: Family Medicine | Admitting: Family Medicine

## 2023-09-09 DIAGNOSIS — R921 Mammographic calcification found on diagnostic imaging of breast: Secondary | ICD-10-CM

## 2024-03-23 ENCOUNTER — Other Ambulatory Visit: Payer: Self-pay | Admitting: Family Medicine

## 2024-03-23 DIAGNOSIS — D500 Iron deficiency anemia secondary to blood loss (chronic): Secondary | ICD-10-CM

## 2024-03-23 DIAGNOSIS — Z1231 Encounter for screening mammogram for malignant neoplasm of breast: Secondary | ICD-10-CM

## 2024-10-20 ENCOUNTER — Other Ambulatory Visit: Payer: Self-pay | Admitting: Family Medicine

## 2024-10-20 DIAGNOSIS — R921 Mammographic calcification found on diagnostic imaging of breast: Secondary | ICD-10-CM

## 2024-10-21 ENCOUNTER — Other Ambulatory Visit: Payer: Self-pay | Admitting: Family Medicine

## 2024-10-21 DIAGNOSIS — R921 Mammographic calcification found on diagnostic imaging of breast: Secondary | ICD-10-CM

## 2024-11-11 ENCOUNTER — Ambulatory Visit: Admitting: Neurology

## 2024-11-11 ENCOUNTER — Encounter: Payer: Self-pay | Admitting: Neurology

## 2024-11-11 ENCOUNTER — Ambulatory Visit (INDEPENDENT_AMBULATORY_CARE_PROVIDER_SITE_OTHER): Admitting: Neurology

## 2024-11-11 VITALS — BP 128/78 | HR 88 | Ht 65.0 in | Wt 160.5 lb

## 2024-11-11 DIAGNOSIS — R419 Unspecified symptoms and signs involving cognitive functions and awareness: Secondary | ICD-10-CM

## 2024-11-11 NOTE — Patient Instructions (Addendum)
 Continue current medications Continue follow-up PCP Call for any additional question or concerns   There are well-accepted and sensible ways to reduce risk for Alzheimers disease and other degenerative brain disorders .  Exercise Daily Walk A daily 20 minute walk should be part of your routine. Disease related apathy can be a significant roadblock to exercise and the only way to overcome this is to make it a daily routine and perhaps have a reward at the end (something your loved one loves to eat or drink perhaps) or a personal trainer coming to the home can also be very useful. Most importantly, the patient is much more likely to exercise if the caregiver / spouse does it with him/her. In general a structured, repetitive schedule is best.  General Health: Any diseases which effect your body will effect your brain such as a pneumonia, urinary infection, blood clot, heart attack or stroke. Keep contact with your primary care doctor for regular follow ups.  Sleep. A good nights sleep is healthy for the brain. Seven hours is recommended. If you have insomnia or poor sleep habits we can give you some instructions. If you have sleep apnea wear your mask.  Diet: Eating a heart healthy diet is also a good idea; fish and poultry instead of red meat, nuts (mostly non-peanuts), vegetables, fruits, olive oil or canola oil (instead of butter), minimal salt (use other spices to flavor foods), whole grain rice, bread, cereal and pasta and wine in moderation.Research is now showing that the MIND diet, which is a combination of The Mediterranean diet and the DASH diet, is beneficial for cognitive processing and longevity. Information about this diet can be found in The MIND Diet, a book by Annitta Feeling, MS, RDN, and online at wildwildscience.es  Finances, Power of [address] and Advance Directives: You should consider putting legal safeguards in place with regard to financial and medical  decision making. While the spouse always has power of attorney for medical and financial issues in the absence of any form, you should consider what you want in case the spouse / caregiver is no longer around or capable of making decisions.

## 2024-11-11 NOTE — Progress Notes (Signed)
 [facility]  PATIENT: Jordan [name] DOB: 05/20/1946  REQUESTING CLINICIAN: Jesus Elberta Gainer, * HISTORY FROM: Patient  REASON FOR VISIT: Memory concerns.    HISTORICAL  CHIEF COMPLAINT:  Chief Complaint  Patient presents with   RM 12     Patient is here alone for memory - sometimes forgets names,but that is normal for her     HISTORY OF PRESENT ILLNESS:  Discussed the use of AI scribe software for clinical note transcription with the patient, who gave verbal consent to proceed.  Jordan LOUGHMILLER is a 78 year old female history of hyperlipidemia and GERD who presents for a memory evaluation. She was referred by PCP for a memory evaluation.  She is unsure of the specific concerns prompting the referral but mentions performing well on memory tests during regular check-ups. She is uncertain if her age might be a factor in the referral.  She lives independently as a widow, managing her household, bills, and car maintenance. Her daughters live away, and she is considering downsizing her home of over forty years. She is active in her community, participating in church activities such as the bell choir and singing choir.  No history of traumatic brain injury, stroke, or seizures. There is no family history of dementia, as her parents lived into their nineties with intact cognitive function.  No issues with daily activities such as cooking, self-care, or driving. She confirms independence in her daily life and has not noticed any significant memory issues herself. Her sister has also not observed any changes in her memory.    TBI:  No past history of TBI Stroke:  no past history of stroke Seizures:  no past history of seizures Sleep:  no history of sleep apnea.  Mood:  patient denies anxiety and depression Family history of Dementia:  Denies  Functional status: independent in all ADLs and IADLs Patient lives alone. Cooking: no issues Cleaning: no issues   Shopping: no issues  Bathing: no issues  Toileting: no issues  Driving: no issues  Bills: no issues  Medications: no issues  Ever left the stove on by accident?: denies Forget how to use items around the house?: denies Getting lost going to familiar places?: denies Forgetting loved ones names?: denies Word finding difficulty? denies Sleep: no issues    OTHER MEDICAL CONDITIONS: Hyperlipidemia, GERD   REVIEW OF SYSTEMS: Full 14 system review of systems performed and negative with exception of: As noted in the HPI   ALLERGIES: Allergies  Allergen Reactions   Atorvastatin Other (See Comments)    Mouth ulcers   Mouth ulcers   Codeine Other (See Comments)    Unknown   Unknown   Meloxicam Nausea Only    HOME MEDICATIONS: Outpatient Medications Prior to Visit  Medication Sig Dispense Refill   cholecalciferol (VITAMIN D3) 25 MCG (1000 UNIT) tablet Take 1,000 Units by mouth daily.     ferrous sulfate 325 (65 FE) MG tablet Take 325 mg by mouth.     MAGNESIUM PO Take 1 tablet by mouth at bedtime.     omeprazole (PRILOSEC) 20 MG capsule Take 20 mg by mouth 2 (two) times daily before a meal.     rosuvastatin (CRESTOR) 10 MG tablet Take 10 mg by mouth at bedtime.     No facility-administered medications prior to visit.    PAST MEDICAL HISTORY: History reviewed. No pertinent past medical history.  PAST SURGICAL HISTORY: Past Surgical History:  Procedure Laterality Date   BREAST  BIOPSY Left 06/14/2020    FAMILY HISTORY: History reviewed. No pertinent family history.  SOCIAL HISTORY: Social History   Socioeconomic History   Marital status: Married    Spouse name: Not on file   Number of children: Not on file   Years of education: Not on file   Highest education level: Not on file  Occupational History   Not on file  Tobacco Use   Smoking status: Never    Passive exposure: Never   Smokeless tobacco: Never  Substance and Sexual Activity   Alcohol use: Not  Currently   Drug use: Not on file   Sexual activity: Not on file  Other Topics Concern   Not on file  Social History Narrative   1-3  cups of coffee, and some un-sweet tea, No sodas    Social Drivers of Corporate Investment Banker Strain: Not on file  Food Insecurity: Low Risk  (03/20/2024)   Received from [facility]   Hunger Vital Sign    Within the past 12 months, you worried that your food would run out before you got money to buy more: Never true    Within the past 12 months, the food you bought just didn't last and you didn't have money to get more. : Never true  Transportation Needs: No Transportation Needs (03/20/2024)   Received from [facility]    In the past 12 months, has lack of reliable transportation kept you from medical appointments, meetings, work or from getting things needed for daily living? : No  Physical Activity: Not on file  Stress: Not on file  Social Connections: Not on file  Intimate Partner Violence: Not on file    PHYSICAL EXAM   GENERAL EXAM/CONSTITUTIONAL: Vitals:  Vitals:   11/11/24 1514  BP: 128/78  Pulse: 88  SpO2: 98%  Weight: 160 lb 8 oz (72.8 kg)  Height: 5' 5 (1.651 m)   Body mass index is 26.71 kg/m. Wt Readings from Last 3 Encounters:  11/11/24 160 lb 8 oz (72.8 kg)   Patient is in no distress; well developed, nourished and groomed; neck is supple  MUSCULOSKELETAL: Gait, strength, tone, movements noted in Neurologic exam below  NEUROLOGIC: MENTAL STATUS:      No data to display            11/11/2024    3:22 PM  Montreal Cognitive Assessment   Visuospatial/ Executive (0/5) 5  Naming (0/3) 2  Attention: Read list of digits (0/2) 2  Attention: Read list of letters (0/1) 1  Attention: Serial 7 subtraction starting at 100 (0/3) 3  Language: Repeat phrase (0/2) 2  Language : Fluency (0/1) 1  Abstraction (0/2) 2  Delayed Recall (0/5) 4  Orientation (0/6) 6  Total 28  Adjusted Score (based on  education) 28    awake, alert, oriented to person, place and time recent and remote memory intact normal attention and concentration language fluent, comprehension intact, naming intact fund of knowledge appropriate  CRANIAL NERVE:  2nd, 3rd, 4th, 6th- visual fields full to confrontation, extraocular muscles intact, no nystagmus 5th - facial sensation symmetric 7th - facial strength symmetric 8th - hearing intact 9th - palate elevates symmetrically, uvula midline 11th - shoulder shrug symmetric 12th - tongue protrusion midline  MOTOR:  normal bulk and tone, full strength in the BUE, BLE  SENSORY:  normal and symmetric to light touch  COORDINATION:  finger-nose-finger, fine finger movements normal  GAIT/STATION:  normal  DIAGNOSTIC DATA (LABS, IMAGING, TESTING) - I reviewed patient records, labs, notes, testing and imaging myself where available.  Lab Results  Component Value Date   WBC 11.1 (H) 10/12/2011   HGB 10.5 (L) 10/12/2011   HCT 32.5 (L) 10/12/2011   MCV 85.3 10/12/2011   PLT 262 10/12/2011      Component Value Date/Time   NA 137 10/12/2011 1355   K 3.4 (L) 10/12/2011 1355   CL 103 10/12/2011 1355   CO2 23 10/12/2011 1355   GLUCOSE 126 (H) 10/12/2011 1355   BUN 12 10/12/2011 1355   CREATININE 0.60 10/12/2011 1355   CALCIUM 8.7 10/12/2011 1355   PROT 6.8 10/12/2011 1355   ALBUMIN 3.4 (L) 10/12/2011 1355   AST 23 10/12/2011 1355   ALT 31 10/12/2011 1355   ALKPHOS 78 10/12/2011 1355   BILITOT 0.4 10/12/2011 1355   GFRNONAA >90 10/12/2011 1355   GFRAA >90 10/12/2011 1355   No results found for: CHOL, HDL, LDLCALC, LDLDIRECT, TRIG, CHOLHDL No results found for: YHAJ8R No results found for: VITAMINB12 No results found for: TSH    ASSESSMENT AND PLAN  78 y.o. year old female with    Memory concerns without evidence of cognitive impairment Memory concerns were evaluated with a normal memory test score of 28 out of 30. She is  independent in daily activities, including managing bills, driving, and self-care. No family history of dementia and no personal history of traumatic brain injury, stroke, or seizures. No clinical suspicion of memory loss or dementia as there are no changes in baseline function or concerns from family members. - No further testing required at this time. - Advised to contact the clinic if there are any changes in memory or cognitive function.   1. Cognitive complaints with normal exam     Patient Instructions  Continue current medications Continue follow-up PCP Call for any additional question or concerns   There are well-accepted and sensible ways to reduce risk for Alzheimers disease and other degenerative brain disorders .  Exercise Daily Walk A daily 20 minute walk should be part of your routine. Disease related apathy can be a significant roadblock to exercise and the only way to overcome this is to make it a daily routine and perhaps have a reward at the end (something your loved one loves to eat or drink perhaps) or a personal trainer coming to the home can also be very useful. Most importantly, the patient is much more likely to exercise if the caregiver / spouse does it with him/her. In general a structured, repetitive schedule is best.  General Health: Any diseases which effect your body will effect your brain such as a pneumonia, urinary infection, blood clot, heart attack or stroke. Keep contact with your primary care doctor for regular follow ups.  Sleep. A good nights sleep is healthy for the brain. Seven hours is recommended. If you have insomnia or poor sleep habits we can give you some instructions. If you have sleep apnea wear your mask.  Diet: Eating a heart healthy diet is also a good idea; fish and poultry instead of red meat, nuts (mostly non-peanuts), vegetables, fruits, olive oil or canola oil (instead of butter), minimal salt (use other spices to flavor foods), whole  grain rice, bread, cereal and pasta and wine in moderation.Research is now showing that the MIND diet, which is a combination of The Mediterranean diet and the DASH diet, is beneficial for cognitive processing and longevity. Information about this diet can  be found in The MIND Diet, a book by Annitta Feeling, MS, RDN, and online at wildwildscience.es  Finances, Power of [address] and Advance Directives: You should consider putting legal safeguards in place with regard to financial and medical decision making. While the spouse always has power of attorney for medical and financial issues in the absence of any form, you should consider what you want in case the spouse / caregiver is no longer around or capable of making decisions.   No orders of the defined types were placed in this encounter.   No orders of the defined types were placed in this encounter.   Return if symptoms worsen or fail to improve.    Pastor Falling, MD 11/11/2024, 4:26 PM  [facility] [address], [address] [place], [place] [address] [phone]

## 2024-12-03 ENCOUNTER — Ambulatory Visit

## 2024-12-03 ENCOUNTER — Inpatient Hospital Stay: Admission: RE | Admit: 2024-12-03 | Discharge: 2024-12-03 | Attending: Family Medicine

## 2024-12-03 ENCOUNTER — Other Ambulatory Visit

## 2024-12-03 DIAGNOSIS — R921 Mammographic calcification found on diagnostic imaging of breast: Secondary | ICD-10-CM

## 2024-12-29 ENCOUNTER — Other Ambulatory Visit (HOSPITAL_BASED_OUTPATIENT_CLINIC_OR_DEPARTMENT_OTHER)

## 2025-01-08 ENCOUNTER — Other Ambulatory Visit (HOSPITAL_BASED_OUTPATIENT_CLINIC_OR_DEPARTMENT_OTHER): Payer: Self-pay | Admitting: Family Medicine

## 2025-01-08 ENCOUNTER — Other Ambulatory Visit: Payer: Self-pay | Admitting: Family Medicine

## 2025-01-08 ENCOUNTER — Other Ambulatory Visit (HOSPITAL_BASED_OUTPATIENT_CLINIC_OR_DEPARTMENT_OTHER)

## 2025-01-08 DIAGNOSIS — M810 Age-related osteoporosis without current pathological fracture: Secondary | ICD-10-CM

## 2025-01-08 DIAGNOSIS — Z13820 Encounter for screening for osteoporosis: Secondary | ICD-10-CM

## 2025-05-18 ENCOUNTER — Other Ambulatory Visit (HOSPITAL_BASED_OUTPATIENT_CLINIC_OR_DEPARTMENT_OTHER)
